# Patient Record
Sex: Female | Born: 1990 | Race: White | Hispanic: No | Marital: Single
Health system: Southern US, Community
[De-identification: ages and names within clinical notes are randomized; demographics above are authoritative.]

---

## 2018-04-26 ENCOUNTER — Other Ambulatory Visit: Payer: Self-pay

## 2018-04-26 ENCOUNTER — Emergency Department (HOSPITAL_COMMUNITY)
Admission: EM | Admit: 2018-04-26 | Discharge: 2018-04-26 | Disposition: A | Discharge status: Home / Self Care | Payer: Self-pay | Attending: Emergency Medicine | Admitting: Emergency Medicine

## 2018-04-26 ENCOUNTER — Emergency Department (HOSPITAL_COMMUNITY): Payer: Self-pay

## 2018-04-26 DIAGNOSIS — W1781XA Fall down embankment (hill), initial encounter: Secondary | ICD-10-CM | POA: Insufficient documentation

## 2018-04-26 DIAGNOSIS — Y9301 Activity, walking, marching and hiking: Secondary | ICD-10-CM | POA: Insufficient documentation

## 2018-04-26 DIAGNOSIS — Y929 Unspecified place or not applicable: Secondary | ICD-10-CM | POA: Insufficient documentation

## 2018-04-26 DIAGNOSIS — S300XXA Contusion of lower back and pelvis, initial encounter: Secondary | ICD-10-CM | POA: Insufficient documentation

## 2018-04-26 DIAGNOSIS — Y999 Unspecified external cause status: Secondary | ICD-10-CM | POA: Insufficient documentation

## 2018-04-26 LAB — POC URINE PREG, ED: Preg Test, Ur: NEGATIVE

## 2018-04-26 MED ORDER — KETOROLAC TROMETHAMINE 15 MG/ML IJ SOLN
30.0000 mg | Freq: Once | INTRAMUSCULAR | Status: AC
  Administered 2018-04-26: 30 mg via INTRAMUSCULAR
  Filled 2018-04-26: qty 2

## 2018-04-26 MED ORDER — ORPHENADRINE CITRATE ER 100 MG PO TB12: 100.0000 mg | ORAL_TABLET | Freq: Two times a day (BID) | ORAL | 0 refills | Status: AC

## 2018-04-26 MED ORDER — MELOXICAM 7.5 MG PO TABS: 7.5000 mg | ORAL_TABLET | Freq: Every day | ORAL | 0 refills | Status: AC

## 2018-04-26 NOTE — ED Triage Notes (Signed)
Pt to ed from home with c/o of tailbone pain after a fall she had on July 1st that has progressively gotten worse. Pt states she was hoping the pain would go away, but today the pain was excruciating.  Pt states she tripped backwards getting tangled with her dog on a rope from standing position to sitting. Pt states pain 9/10 when sitting feels a pinch only when standing.

## 2018-04-26 NOTE — Discharge Instructions (Addendum)
Use donut cushion for sitting, these can be found at medical supply stores. Take Meloxicam and Norflex as needed as prescribed for pain. Follow up with PCP, referrals given if needed.

## 2018-04-26 NOTE — ED Provider Notes (Signed)
[facility]-EMERGENCY DEPT Provider Note   CSN: [id number] Arrival date & time: 04/26/18  1613     History   Chief Complaint Chief Complaint  Patient presents with  . Tailbone Pain    HPI Kylie Wu is a 27 y.o. female.  27 year old female presents with pain and her tailbone.  Patient states that she was walking down an embankment on July 1 when she slipped on the gravel and fell landing on her buttocks.  Patient has minor abrasions to her right lower leg which are healing well but reports ongoing pain in her tailbone area, worse with sitting, worse with standing after she has been sitting.  Denies constipation, rectal pain, difficult with bowel movements.  No other injuries, complaints or concerns.  Patient has taken Motrin on occasion without relief of her pain.  Last tetanus was less than 5 years ago.     No past medical history on file.  There are no active problems to display for this patient.   OB History   None      Home Medications    Prior to Admission medications   Medication Sig Start Date End Date Taking? Authorizing Provider  meloxicam (MOBIC) 7.5 MG tablet Take 1 tablet (7.5 mg total) by mouth daily for 10 days. 04/26/18 05/06/18  Jeannie FendMurphy, Laura A, PA-C  orphenadrine (NORFLEX) 100 MG tablet Take 1 tablet (100 mg total) by mouth 2 (two) times daily for 10 days. 04/26/18 05/06/18  Jeannie FendMurphy, Laura A, PA-C    Family History No family history on file.  Social History Social History   Tobacco Use  . Smoking status: Not on file  Substance Use Topics  . Alcohol use: Not on file  . Drug use: Not on file     Allergies   Patient has no allergy information on record.   Review of Systems Review of Systems  Constitutional: Negative for fever.  Gastrointestinal: Negative for abdominal pain, blood in stool, constipation, diarrhea, nausea and vomiting.  Musculoskeletal: Positive for back pain. Negative for arthralgias and myalgias.    Skin: Positive for wound.  Allergic/Immunologic: Negative for immunocompromised state.  Neurological: Negative for weakness and numbness.  Hematological: Does not bruise/bleed easily.  Psychiatric/Behavioral: Negative for confusion.  All other systems reviewed and are negative.    Physical Exam Updated Vital Signs BP (!) 122/93 (BP Location: Right Arm)   Pulse 86   Temp 98.1 F (36.7 C) (Oral)   Resp 16   Ht 5\' 3"  (1.6 m)   Wt 99.8 kg (220 lb)   LMP 04/22/2018 Comment: Neg Pregnancy test today  SpO2 100%   BMI 38.97 kg/m   Physical Exam  Constitutional: She is oriented to person, place, and time. She appears well-developed and well-nourished. No distress.  HENT:  Head: Normocephalic and atraumatic.  Cardiovascular: Intact distal pulses.  Pulmonary/Chest: Effort normal.  Musculoskeletal: She exhibits tenderness. She exhibits no deformity.       Lumbar back: She exhibits bony tenderness. She exhibits no swelling, no edema and no deformity.       Back:       Legs: Neurological: She is alert and oriented to person, place, and time.  Skin: Skin is warm and dry. Capillary refill takes less than 2 seconds. No rash noted. She is not diaphoretic. No erythema.  Psychiatric: She has a normal mood and affect. Her behavior is normal.  Nursing note and vitals reviewed.    ED Treatments / Results  Labs (all labs  ordered are listed, but only abnormal results are displayed) Labs Reviewed  POC URINE PREG, ED    EKG None  Radiology Dg Sacrum/coccyx  Result Date: 04/26/2018 CLINICAL DATA:  Tail bone pain after fall last week. EXAM: SACRUM AND COCCYX - 2+ VIEW COMPARISON:  None. FINDINGS: There is no evidence of fracture or other focal bone lesions. IMPRESSION: Normal sacrum and coccyx. Electronically Signed   By: Lupita Raider, M.D.   On: 04/26/2018 19:20    Procedures Procedures (including critical care time)  Medications Ordered in ED Medications  ketorolac (TORADOL)  15 MG/ML injection 30 mg (has no administration in time range)     Initial Impression / Assessment and Plan / ED Course  I have reviewed the triage vital signs and the nursing notes.  Pertinent labs & imaging results that were available during my care of the patient were reviewed by me and considered in my medical decision making (see chart for details).  Clinical Course as of Apr 26 1938  Wed Apr 26, 2018  2952 27 year old female presents with complaint of pain in her tailbone after slipping and falling, landing on her buttocks 9 days ago.  On exam patient has tenderness patient over her sacral dimple.  X-ray is negative for fracture.  Advised this area is difficult to evaluate by x-ray however treatment generally involves pain control regardless of fx or no fx.  Recommend a donut cushion to sit on for comfort, given prescription for meloxicam and Norflex.  Referral to PCP, return to ER for worsening or concerning symptoms.  Patient verbalizes understanding of discharge instructions and plan.   [LM]    Clinical Course User Index [LM] Jeannie Fend, PA-C    Final Clinical Impressions(s) / ED Diagnoses   Final diagnoses:  Contusion of coccyx, initial encounter    ED Discharge Orders        Ordered    meloxicam (MOBIC) 7.5 MG tablet  Daily     04/26/18 1927    orphenadrine (NORFLEX) 100 MG tablet  2 times daily     04/26/18 1927       Jeannie Fend, PA-C 04/26/18 1940    Jacalyn Lefevre, MD 04/26/18 2031

## 2018-04-26 NOTE — ED Notes (Signed)
Pt reports that she fell on July 1 on to her tailbone. Pt reports pain is worse with sitting. Pt reports that pain has gotten worse. 8/10 , and reports not taking anything at this time for the pain.

## 2019-07-13 IMAGING — CR DG SACRUM/COCCYX 2+V
3 series · 3 of 3 positions shown · non-contrast
Comparison: None.

CLINICAL DATA: Tail bone pain after fall last week.

EXAM:
SACRUM AND COCCYX - 2+ VIEW

[t sacrum ap]
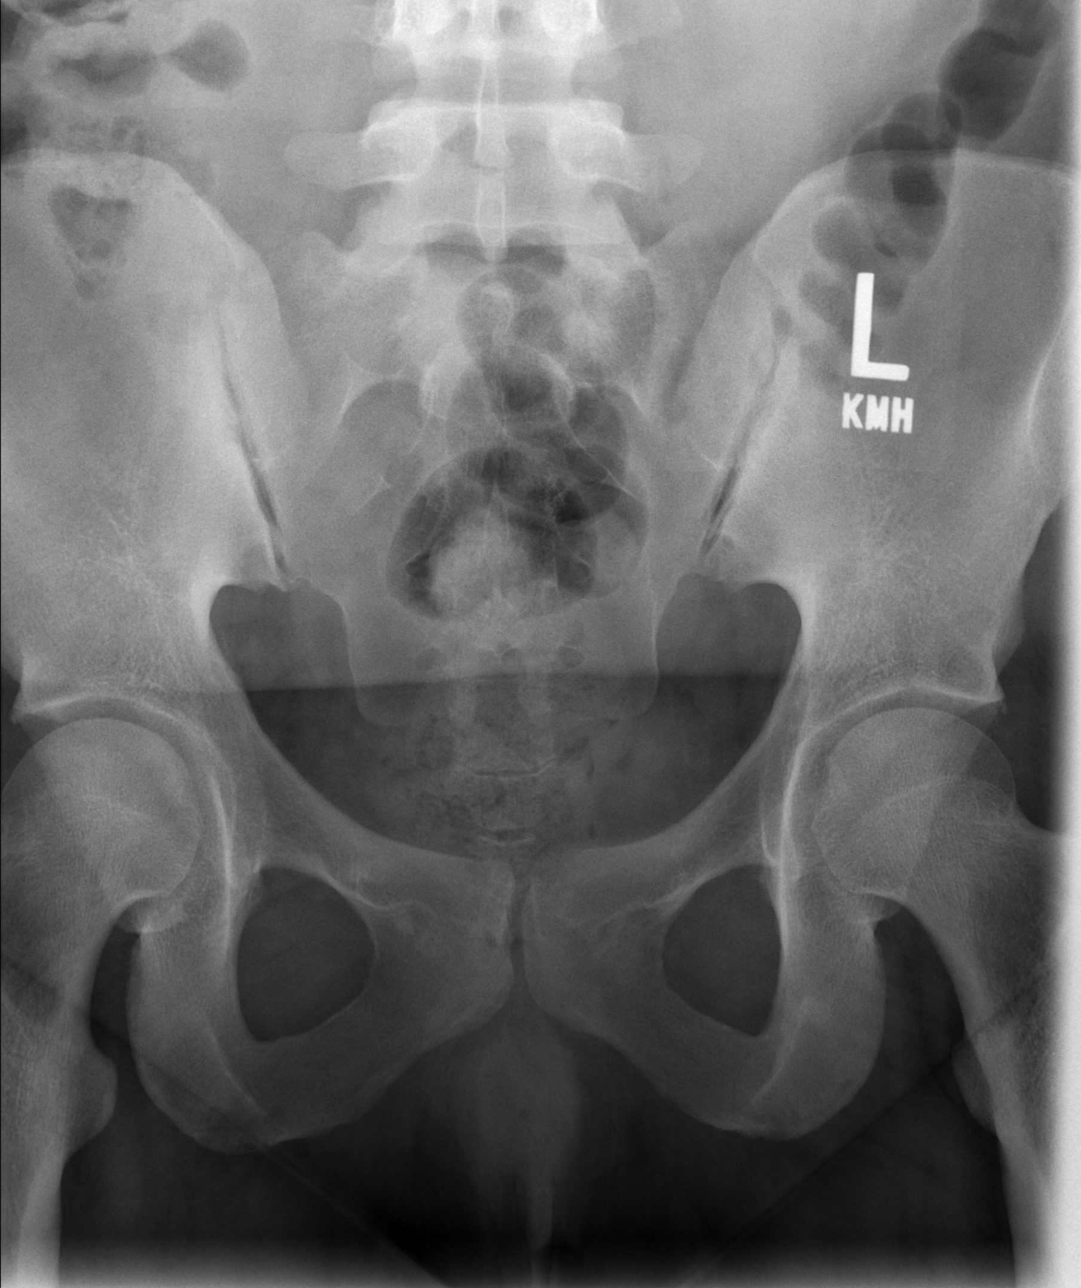

[t coccyx ap]
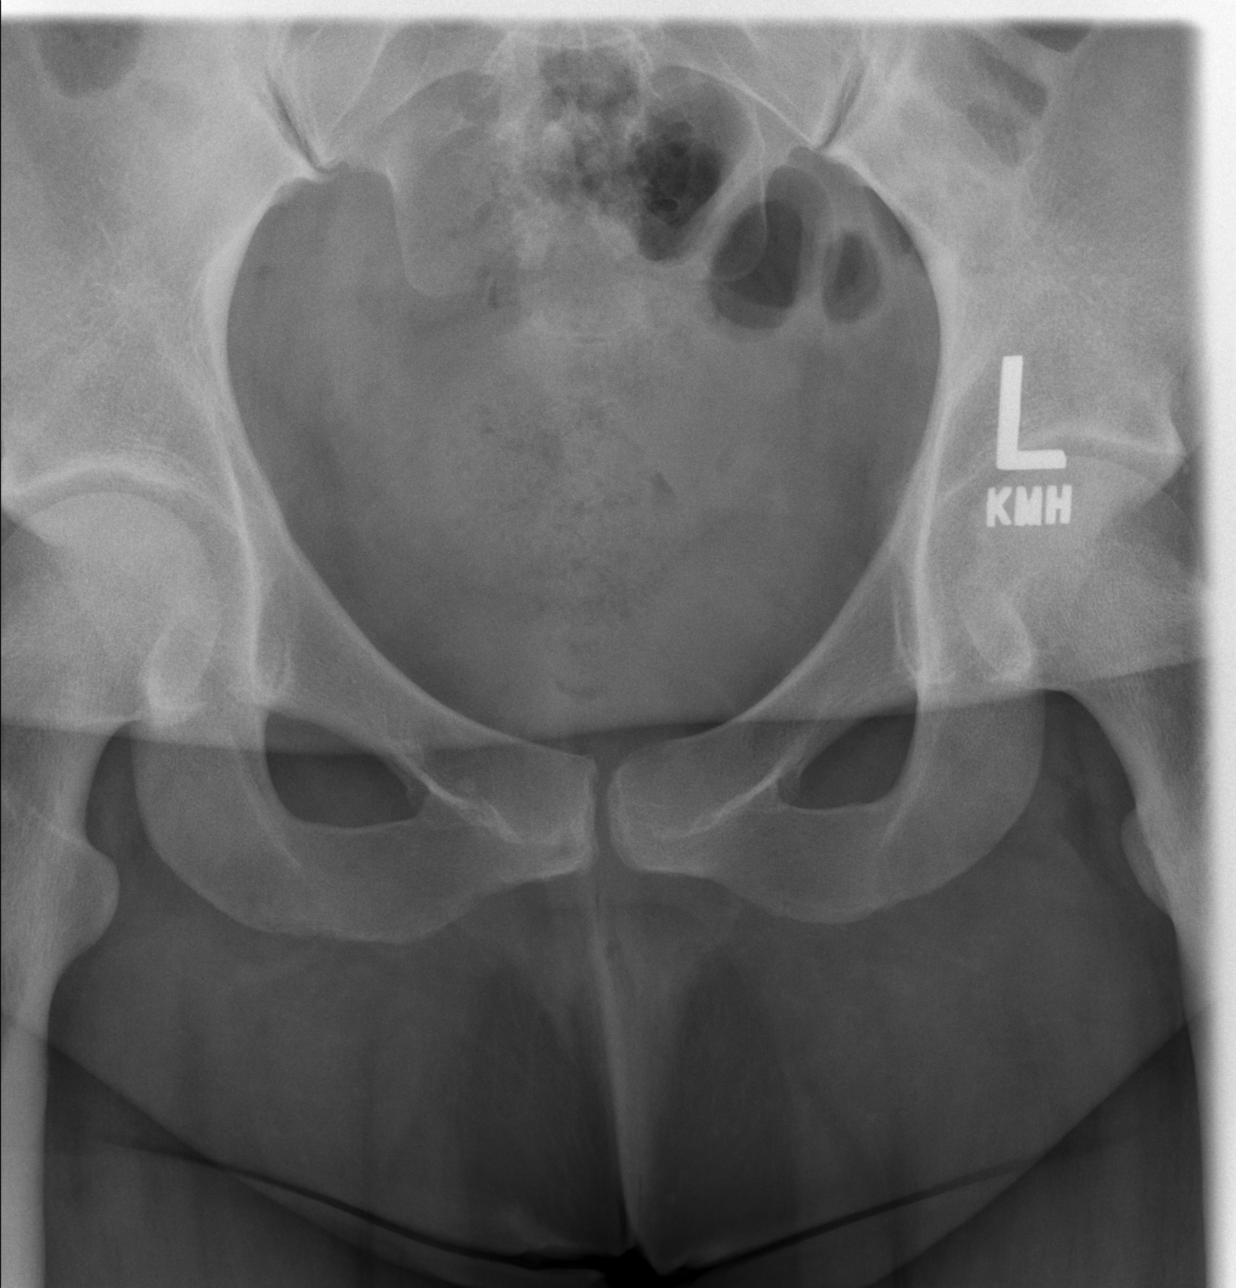

[t sacrum coccyx lat]
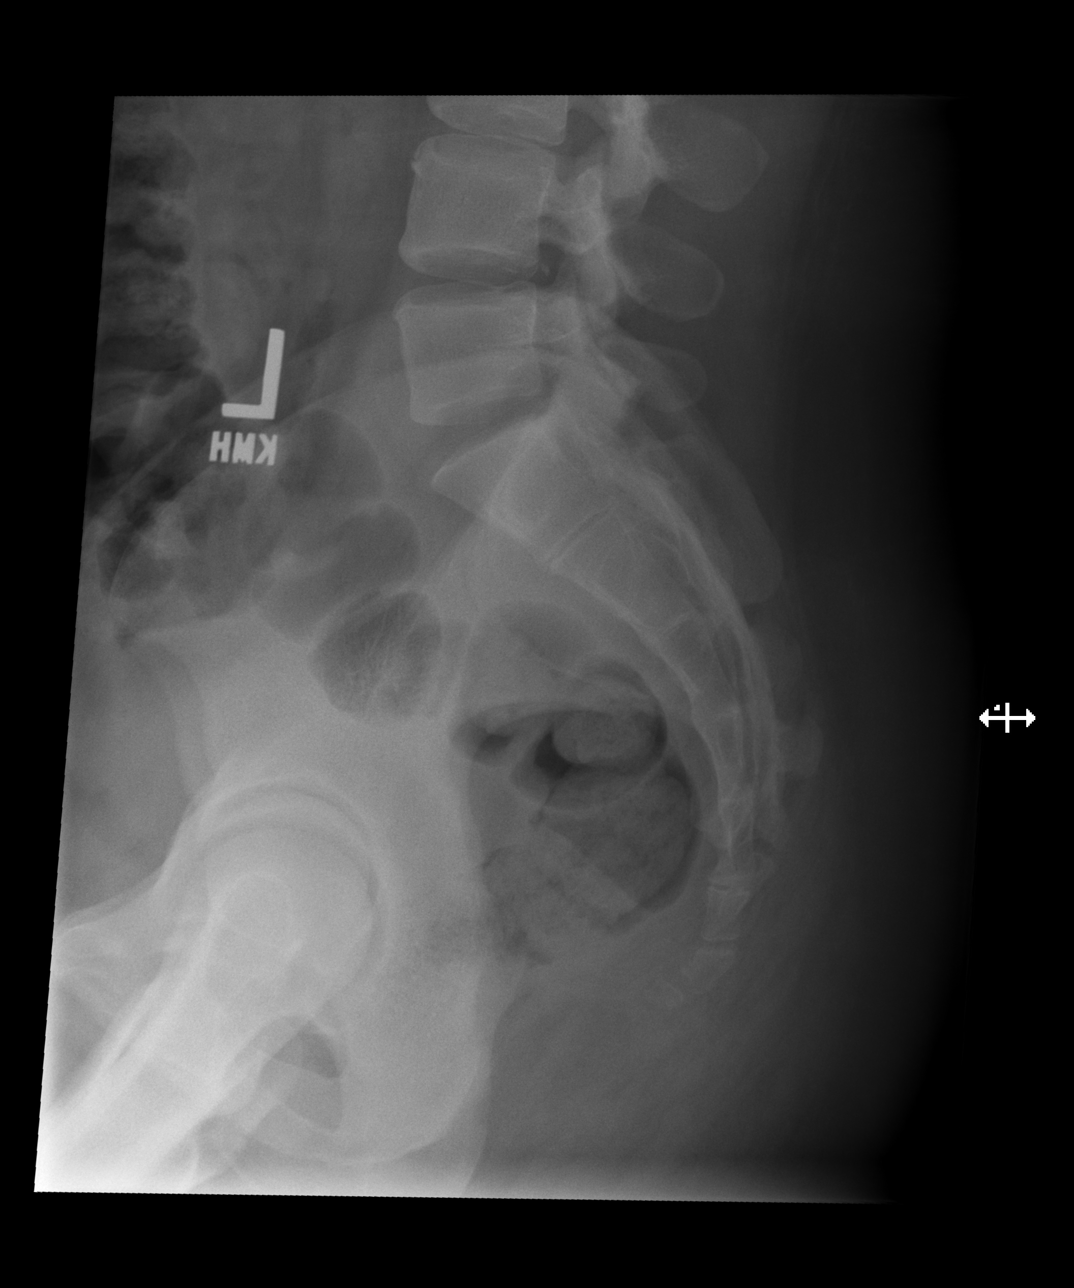

[3 of 3 positions shown; findings below may reference images not displayed]

FINDINGS: There is no evidence of fracture or other focal bone lesions.
IMPRESSION: Normal sacrum and coccyx.
